# Patient Record
Sex: Female | Born: 1975
Health system: Southern US, Community
[De-identification: ages and names within clinical notes are randomized; demographics above are authoritative.]

---

## 2008-07-29 ENCOUNTER — Encounter: Admission: RE | Admit: 2008-07-29 | Discharge: 2008-07-29 | Payer: Self-pay | Admitting: Family Medicine

## 2010-06-04 ENCOUNTER — Encounter: Payer: Self-pay | Admitting: Family Medicine

## 2010-07-12 ENCOUNTER — Inpatient Hospital Stay (HOSPITAL_COMMUNITY)
Admission: AD | Admit: 2010-07-12 | Discharge: 2010-07-15 | DRG: 373 | Disposition: A | Discharge status: Home / Self Care | Payer: BC Managed Care – PPO | Source: Ambulatory Visit | Attending: Obstetrics and Gynecology | Admitting: Obstetrics and Gynecology

## 2010-07-12 LAB — CBC
HCT: 39.3 % (ref 36.0–46.0)
Hemoglobin: 13.6 g/dL (ref 12.0–15.0)
MCH: 31 pg (ref 26.0–34.0)
MCHC: 34.6 g/dL (ref 30.0–36.0)
MCV: 89.5 fL (ref 78.0–100.0)

## 2010-07-14 LAB — CBC
HCT: 25.5 % — ABNORMAL LOW (ref 36.0–46.0)
MCH: 30.5 pg (ref 26.0–34.0)
MCHC: 33.7 g/dL (ref 30.0–36.0)
MCV: 90.4 fL (ref 78.0–100.0)
RDW: 13 % (ref 11.5–15.5)

## 2012-03-18 ENCOUNTER — Other Ambulatory Visit: Payer: Self-pay | Admitting: Obstetrics and Gynecology

## 2012-03-18 DIAGNOSIS — N6320 Unspecified lump in the left breast, unspecified quadrant: Secondary | ICD-10-CM

## 2012-03-27 ENCOUNTER — Other Ambulatory Visit: Payer: BC Managed Care – PPO

## 2012-06-02 ENCOUNTER — Other Ambulatory Visit: Payer: BC Managed Care – PPO

## 2012-06-04 ENCOUNTER — Other Ambulatory Visit: Payer: BC Managed Care – PPO

## 2012-06-13 ENCOUNTER — Ambulatory Visit
Admission: RE | Admit: 2012-06-13 | Discharge: 2012-06-13 | Disposition: A | Discharge status: Home / Self Care | Payer: BC Managed Care – PPO | Source: Ambulatory Visit | Attending: Obstetrics and Gynecology | Admitting: Obstetrics and Gynecology

## 2012-06-13 DIAGNOSIS — N6320 Unspecified lump in the left breast, unspecified quadrant: Secondary | ICD-10-CM

## 2017-07-20 ENCOUNTER — Encounter: Payer: Self-pay | Admitting: Physician Assistant

## 2017-07-20 ENCOUNTER — Ambulatory Visit (INDEPENDENT_AMBULATORY_CARE_PROVIDER_SITE_OTHER): Payer: 59 | Admitting: Physician Assistant

## 2017-07-20 ENCOUNTER — Ambulatory Visit (INDEPENDENT_AMBULATORY_CARE_PROVIDER_SITE_OTHER): Payer: 59

## 2017-07-20 ENCOUNTER — Other Ambulatory Visit: Payer: Self-pay

## 2017-07-20 VITALS — BP 104/67 | HR 74 | Temp 98.6°F | Resp 16 | Ht 63.0 in | Wt 101.0 lb

## 2017-07-20 DIAGNOSIS — M5416 Radiculopathy, lumbar region: Secondary | ICD-10-CM

## 2017-07-20 DIAGNOSIS — M5441 Lumbago with sciatica, right side: Secondary | ICD-10-CM

## 2017-07-20 LAB — POCT URINALYSIS DIP (MANUAL ENTRY)
Bilirubin, UA: NEGATIVE
Blood, UA: NEGATIVE
Glucose, UA: NEGATIVE mg/dL
Nitrite, UA: NEGATIVE
Protein Ur, POC: NEGATIVE mg/dL
Spec Grav, UA: 1.025 (ref 1.010–1.025)
Urobilinogen, UA: 0.2 E.U./dL
pH, UA: 6 (ref 5.0–8.0)

## 2017-07-20 LAB — POCT URINE PREGNANCY: Preg Test, Ur: NEGATIVE

## 2017-07-20 LAB — POC MICROSCOPIC URINALYSIS (UMFC): Mucus: ABSENT

## 2017-07-20 MED ORDER — PREDNISONE 20 MG PO TABS: ORAL_TABLET | ORAL | 0 refills | Status: DC

## 2017-07-20 MED ORDER — MELOXICAM 15 MG PO TABS: 15.0000 mg | ORAL_TABLET | Freq: Every day | ORAL | 0 refills | Status: DC

## 2017-07-20 MED ORDER — CYCLOBENZAPRINE HCL 10 MG PO TABS: 10.0000 mg | ORAL_TABLET | Freq: Three times a day (TID) | ORAL | 0 refills | Status: DC | PRN

## 2017-07-20 NOTE — Patient Instructions (Addendum)
I am treating you today for radiculopathy (see below). Radiculopathy is the medical term for the pain, numbness, or tingling that occurs when nerves coming from the spinal cord get pinched or damaged.  Start taking the following medications: - Meloxicam is an NSAID. Do not use with any other otc pain medication other than tylenol/acetaminophen - so no aleve, ibuprofen, motrin, advil, etc. You can still take Tylenol while taking this.  - Flexeril is a muscle relaxer. This may make you drowsy - if this makes you drowsy, only take it at night.  - Prednisone will help reduce the inflammation of your nerve. Take the entire course.  You can take these medications at the same time - they do not interact.   - Activity modification is as important as pain treatment. The goals are to lessen nerve root impingement and to avoid activities that worsen the pain. Modification or avoidance of activity does not mean prolonged bed rest. When the acute symptoms decrease, I recommend resuming modest physical activity as tolerated.  - Once your acute pain is better, start stretches (farther below). - Come back and see me in 3-4 weeks if you are not improving.    Sciatica Sciatica is pain, numbness, weakness, or tingling along the path of the sciatic nerve. The sciatic nerve starts in the lower back and runs down the back of each leg. The nerve controls the muscles in the lower leg and in the back of the knee. It also provides feeling (sensation) to the back of the thigh, the lower leg, and the sole of the foot. Sciatica is a symptom of another medical condition that pinches or puts pressure on the sciatic nerve. Generally, sciatica only affects one side of the body. Sciatica usually goes away on its own or with treatment. In some cases, sciatica may keep coming back (recur). What are the causes? This condition is caused by pressure on the sciatic nerve, or pinching of the sciatic nerve. This may be the result of:  A  disk in between the bones of the spine (vertebrae) bulging out too far (herniated disk).  Age-related changes in the spinal disks (degenerative disk disease).  A pain disorder that affects a muscle in the buttock (piriformis syndrome).  Extra bone growth (bone spur) near the sciatic nerve.  An injury or break (fracture) of the pelvis.  Pregnancy.  Tumor (rare).  What increases the risk? The following factors may make you more likely to develop this condition:  Playing sports that place pressure or stress on the spine, such as football or weight lifting.  Having poor strength and flexibility.  A history of back injury.  A history of back surgery.  Sitting for long periods of time.  Doing activities that involve repetitive bending or lifting.  Obesity.  What are the signs or symptoms? Symptoms can vary from mild to very severe, and they may include:  Any of these problems in the lower back, leg, hip, or buttock: ? Mild tingling or dull aches. ? Burning sensations. ? Sharp pains.  Numbness in the back of the calf or the sole of the foot.  Leg weakness.  Severe back pain that makes movement difficult.  These symptoms may get worse when you cough, sneeze, or laugh, or when you sit or stand for long periods of time. Being overweight may also make symptoms worse. In some cases, symptoms may recur over time. How is this diagnosed? This condition may be diagnosed based on:  Your symptoms.  A physical exam. Your health care provider may ask you to do certain movements to check whether those movements trigger your symptoms.  You may have tests, including: ? Blood tests. ? X-rays. ? MRI. ? CT scan.  How is this treated? In many cases, this condition improves on its own, without any treatment. However, treatment may include:  Reducing or modifying physical activity during periods of pain.  Exercising and stretching to strengthen your abdomen and improve the  flexibility of your spine.  Icing and applying heat to the affected area.  Medicines that help: ? To relieve pain and swelling. ? To relax your muscles.  Injections of medicines that help to relieve pain, irritation, and inflammation around the sciatic nerve (steroids).  Surgery.  Follow these instructions at home: Medicines  Take over-the-counter and prescription medicines only as told by your health care provider.  Do not drive or operate heavy machinery while taking prescription pain medicine. Managing pain  If directed, apply ice to the affected area. ? Put ice in a plastic bag. ? Place a towel between your skin and the bag. ? Leave the ice on for 20 minutes, 2-3 times a day.  After icing, apply heat to the affected area before you exercise or as often as told by your health care provider. Use the heat source that your health care provider recommends, such as a moist heat pack or a heating pad. ? Place a towel between your skin and the heat source. ? Leave the heat on for 20-30 minutes. ? Remove the heat if your skin turns bright red. This is especially important if you are unable to feel pain, heat, or cold. You may have a greater risk of getting burned. Activity  Return to your normal activities as told by your health care provider. Ask your health care provider what activities are safe for you. ? Avoid activities that make your symptoms worse.  Take brief periods of rest throughout the day. Resting in a lying or standing position is usually better than sitting to rest. ? When you rest for longer periods, mix in some mild activity or stretching between periods of rest. This will help to prevent stiffness and pain. ? Avoid sitting for long periods of time without moving. Get up and move around at least one time each hour.  Exercise and stretch regularly, as told by your health care provider.  Do not lift anything that is heavier than 10 lb (4.5 kg) while you have symptoms  of sciatica. When you do not have symptoms, you should still avoid heavy lifting, especially repetitive heavy lifting.  When you lift objects, always use proper lifting technique, which includes: ? Bending your knees. ? Keeping the load close to your body. ? Avoiding twisting. General instructions  Use good posture. ? Avoid leaning forward while sitting. ? Avoid hunching over while standing.  Maintain a healthy weight. Excess weight puts extra stress on your back and makes it difficult to maintain good posture.  Wear supportive, comfortable shoes. Avoid wearing high heels.  Avoid sleeping on a mattress that is too soft or too hard. A mattress that is firm enough to support your back when you sleep may help to reduce your pain.  Keep all follow-up visits as told by your health care provider. This is important. Contact a health care provider if:  You have pain that wakes you up when you are sleeping.  You have pain that gets worse when you lie down.  Your  pain is worse than you have experienced in the past.  Your pain lasts longer than 4 weeks.  You experience unexplained weight loss. Get help right away if:  You lose control of your bowel or bladder (incontinence).  You have: ? Weakness in your lower back, pelvis, buttocks, or legs that gets worse. ? Redness or swelling of your back. ? A burning sensation when you urinate. This information is not intended to replace advice given to you by your health care provider. Make sure you discuss any questions you have with your health care provider. Document Released: 04/24/2001 Document Revised: 10/04/2015 Document Reviewed: 01/07/2015 Elsevier Interactive Patient Education  2018 [employer]   Herniated Disk Rehab Ask your health care provider which exercises are safe for you. Do exercises exactly as told by your health care provider and adjust them as directed. It is normal to feel mild stretching, pulling, tightness, or  discomfort as you do these exercises, but you should stop right away if you feel sudden pain or your pain gets worse. Stretching and range of motion exercises These exercises warm up your muscles and joints and improve the movement and flexibility of your back. These exercises also help to relieve pain, numbness, and tingling. Exercise A: Prone extension on elbows  1. Lie on your abdomen on a firm surface. 2. Prop yourself up on your elbows. 3. Use your arms to help lift your chest up until you feel a gentle stretch in your abdomen and your lower back. ? This will place some of your body weight on your elbows. If this is uncomfortable, try stacking pillows under your chest. ? Your hips should stay down, against the surface that you are lying on. Keep your hip and back muscles relaxed. 4. Hold for __________ seconds. 5. Slowly relax your upper body and return to the starting position. Repeat __________ times. Complete this exercise __________ times a day. Exercise B: Standing extension 1. Stand with your feet shoulder width apart. 2. Place your hands on your lower back, with your palms on your back. 3. Gently arch your back and press forward with your hands. 4. Hold for __________ seconds. 5. Slowly return to the starting position. Repeat __________ times. Complete this exercise__________ times a day. Strengthening exercises These exercises build strength and endurance in your back. Endurance is the ability to use your muscles for a long time, even after they get tired. Exercise C: Pelvic tilt 1. Lie on your back on a firm surface. Bend your knees and keep your feet flat. 2. Tense your abdominal muscles. Tip your pelvis up toward the ceiling and flatten your lower back into the floor. ? To help with this exercise, you may place a small towel under your lower back and try to push your back into the towel. 3. Hold for __________ seconds. 4. Let your muscles relax completely before you repeat  this exercise. Repeat __________ times. Complete this exercise __________ times a day. Exercise D: Alternating arm and leg raises  1. Get on your hands and knees on a firm surface. If you are on a hard floor, you may want to use padding to cushion your knees, such as an exercise mat. 2. Line up your arms and legs. Your hands should be below your shoulders, and your knees should be below your hips. 3. Lift your left leg behind you. At the same time, raise your right arm and straighten it in front of you. ? Do not lift your leg higher than  your hip. ? Do not lift your arm higher than your shoulder. ? Keep your abdominal and back muscles tight. ? Keep your hips facing the ground. ? Do not arch your back. ? Keep your balance carefully, and do not hold your breath. 4. Hold for __________ seconds. 5. Slowly return to the starting position and repeat with your right leg and your left arm. Repeat __________ times. Complete this exercise __________ times a day. Exercise E: Bridge  1. Lie on your back on a firm surface with your knees bent and your feet flat on the floor. 2. Tighten your abdominal and buttocks muscles and lift your bottom and abdomen off the floor until your trunk is level with your thighs. ? You should feel the muscles working in your buttocks and the back of your thighs. ? Do not arch your back. ? If this exercise is too easy, try doing it with your arms crossed over your chest. 3. Hold this position for __________ seconds. 4. Slowly lower your hips to the starting position. 5. Let your muscles relax completely between repetitions. Repeat __________ times. Complete this exercise __________ times a day. This information is not intended to replace advice given to you by your health care provider. Make sure you discuss any questions you have with your health care provider. Document Released: 04/30/2005 Document Revised: 01/05/2016 Document Reviewed: 02/15/2015 Elsevier Interactive  Patient Education  2018 [employer].

## 2017-07-20 NOTE — Progress Notes (Signed)
Jordan Bowman  MRN: [id number] DOB: 09/21/1975  PCP: Patient, No Pcp Per  Subjective:  Pt is a 42 year old female who presents to clinic for back and leg pain x several weeks. 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 She believes pain started after moving a heavy load. Pain has been present for several weeks however significantly worsened over the past week.   Pain is of her low back and radiates to the front of her right thigh. She cannot find a comfortable position to sit. Bending over makes it worse.   She has been taking ibuprofen and tylenol. She has been using heat and ice - This is helping.   Denies muscle weakness, t/n, loss of bowel or bladder function, urinary symptoms.   Review of Systems  Constitutional: Negative for chills, diaphoresis, fatigue and fever.  Gastrointestinal: Negative for abdominal pain, diarrhea, nausea and vomiting.  Musculoskeletal: Positive for arthralgias, back pain and myalgias. Negative for gait problem.  Skin: Negative.   Neurological: Negative for weakness and numbness.    There are no active problems to display for this patient.   No current outpatient medications on file prior to visit.   No current facility-administered medications on file prior to visit.     No Known Allergies   Objective:  BP 104/67   Pulse 74   Temp 98.6 F (37 C) (Oral)   Resp 16   Ht 5\' 3"  (1.6 m)   Wt 101 lb (45.8 kg)   LMP 06/14/2017   SpO2 100%   BMI 17.89 kg/m   Physical Exam  Constitutional: She is oriented to person, place, and time and well-developed, well-nourished, and in no distress. No distress.  Cardiovascular: Normal rate, regular rhythm and normal heart sounds.  Musculoskeletal:       Lumbar back: She exhibits decreased range of motion (with bending backwards from standing position. ), tenderness (midline) and bony tenderness. She exhibits no spasm.  Neurological: She is alert and oriented to person, place, and  time. She has normal sensation. She displays no weakness. No sensory deficit. She has an abnormal Straight Leg Raise Test. Gait normal. GCS score is 15.  Skin: Skin is warm and dry.  Psychiatric: Mood, memory, affect and judgment normal.  Vitals reviewed.  Dg Lumbar Spine Complete  Result Date: 07/20/2017 CLINICAL DATA:  Radiating low back pain EXAM: LUMBAR SPINE - COMPLETE 4+ VIEW COMPARISON:  None. FINDINGS: There is no evidence of lumbar spine fracture. Alignment is normal. Intervertebral disc spaces are maintained. IMPRESSION: Negative. Electronically Signed   By: Charlett Nose M.D.   On: 07/20/2017 15:54   Results for orders placed or performed in visit on 07/20/17  POCT urinalysis dipstick  Result Value Ref Range   Color, UA yellow yellow   Clarity, UA clear clear   Glucose, UA negative negative mg/dL   Bilirubin, UA negative negative   Ketones, POC UA trace (5) (A) negative mg/dL   Spec Grav, UA 4.098 1.191 - 1.025   Blood, UA negative negative   pH, UA 6.0 5.0 - 8.0   Protein Ur, POC negative negative mg/dL   Urobilinogen, UA 0.2 0.2 or 1.0 E.U./dL   Nitrite, UA Negative Negative   Leukocytes, UA Trace (A) Negative  POCT urine pregnancy  Result Value Ref Range   Preg Test, Ur Negative Negative  POCT Microscopic Urinalysis ([facility])  Result Value Ref Range   WBC,UR,HPF,POC None None WBC/hpf   RBC,UR,HPF,POC None  None RBC/hpf   Bacteria None None, Too numerous to count   Mucus Absent Absent   Epithelial Cells, UR Per Microscopy Moderate (A) None, Too numerous to count cells/hpf    Assessment and Plan :  1. Lumbar radiculopathy 2. Right-sided low back pain with right-sided sciatica, unspecified chronicity - DG Lumbar Spine Complete; Future - predniSONE (DELTASONE) 20 MG tablet; Take 3 PO QAM x3days, 2 PO QAM x3days, 1 PO QAM x3days  Dispense: 18 tablet; Refill: 0 - meloxicam (MOBIC) 15 MG tablet; Take 1 tablet (15 mg total) by mouth daily.  Dispense: 30 tablet; Refill: 0 -  cyclobenzaprine (FLEXERIL) 10 MG tablet; Take 1 tablet (10 mg total) by mouth 3 (three) times daily as needed for muscle spasms.  Dispense: 30 tablet; Refill: 0 - POCT urinalysis dipstick - POCT urine pregnancy - POCT Microscopic Urinalysis ([facility]) - Pt presents with worsening and radiating low back pain s/p lifting/moving injury x several weeks. HPI and PE suggest lumbar radiculopathy. X-ray is negative. Hcg is negative. UA +trace leukocytes, will await urine culture. Plan to treat with prednisone, mobic and flexeril. Advised stretches once acute pain has resolved. RTC in 3-4 weeks if no improvement. Consider PT or ortho referral. She understands and agrees with plan.   Marco CollieWhitney Jaielle Dlouhy, PA-C  [facility] at [facility] 07/20/2017 3:09 PM

## 2017-08-07 ENCOUNTER — Encounter: Payer: Self-pay | Admitting: Physician Assistant

## 2017-08-07 ENCOUNTER — Ambulatory Visit (INDEPENDENT_AMBULATORY_CARE_PROVIDER_SITE_OTHER): Payer: 59 | Admitting: Physician Assistant

## 2017-08-07 VITALS — BP 99/68 | HR 102 | Temp 102.3°F | Resp 17 | Ht 64.5 in | Wt 100.0 lb

## 2017-08-07 DIAGNOSIS — R059 Cough, unspecified: Secondary | ICD-10-CM

## 2017-08-07 DIAGNOSIS — R05 Cough: Secondary | ICD-10-CM | POA: Diagnosis not present

## 2017-08-07 DIAGNOSIS — R509 Fever, unspecified: Secondary | ICD-10-CM

## 2017-08-07 DIAGNOSIS — R6889 Other general symptoms and signs: Secondary | ICD-10-CM

## 2017-08-07 DIAGNOSIS — J101 Influenza due to other identified influenza virus with other respiratory manifestations: Secondary | ICD-10-CM | POA: Diagnosis not present

## 2017-08-07 LAB — POC INFLUENZA A&B (BINAX/QUICKVUE)
Influenza A, POC: NEGATIVE
Influenza B, POC: POSITIVE — AB

## 2017-08-07 MED ORDER — MUCINEX DM MAXIMUM STRENGTH 60-1200 MG PO TB12: 1.0000 | ORAL_TABLET | Freq: Two times a day (BID) | ORAL | 1 refills | Status: DC

## 2017-08-07 MED ORDER — HYDROCODONE-HOMATROPINE 5-1.5 MG/5ML PO SYRP: 5.0000 mL | ORAL_SOLUTION | Freq: Three times a day (TID) | ORAL | 0 refills | Status: DC | PRN

## 2017-08-07 MED ORDER — ACETAMINOPHEN 500 MG PO TABS
500.0000 mg | ORAL_TABLET | Freq: Once | ORAL | Status: AC
Start: 2017-08-07 — End: 2017-08-07
  Administered 2017-08-07: 500 mg via ORAL

## 2017-08-07 NOTE — Progress Notes (Signed)
   Jordan Bowman  MRN: [id number] DOB: 1976/03/12  PCP: Patient, No Pcp Per  Subjective:  Pt is a 42 year old female who presents to clinic for flu-like symptoms x 4 days. C/o fever, runny nose, cough and HA. +photosensativity. 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   Review of Systems  Constitutional: Positive for fever. Negative for chills, diaphoresis and fatigue.  HENT: Negative for congestion, postnasal drip, rhinorrhea, sinus pressure, sinus pain and sore throat.   Respiratory: Negative for cough, shortness of breath and wheezing.   Neurological: Positive for headaches.  Psychiatric/Behavioral: Negative for sleep disturbance.    There are no active problems to display for this patient.   Current Outpatient Medications on File Prior to Visit  Medication Sig Dispense Refill  . cyclobenzaprine (FLEXERIL) 10 MG tablet Take 1 tablet (10 mg total) by mouth 3 (three) times daily as needed for muscle spasms. (Patient not taking: Reported on 08/07/2017) 30 tablet 0  . meloxicam (MOBIC) 15 MG tablet Take 1 tablet (15 mg total) by mouth daily. (Patient not taking: Reported on 08/07/2017) 30 tablet 0  . predniSONE (DELTASONE) 20 MG tablet Take 3 PO QAM x3days, 2 PO QAM x3days, 1 PO QAM x3days (Patient not taking: Reported on 08/07/2017) 18 tablet 0   No current facility-administered medications on file prior to visit.     No Known Allergies   Objective:  BP 99/68   Pulse (!) 102   Temp (!) 102.3 F (39.1 C) (Oral)   Resp 17   Ht 5' 4.5" (1.638 m)   Wt 100 lb (45.4 kg)   LMP 07/31/2017 (Approximate)   SpO2 98%   BMI 16.90 kg/m   Physical Exam  Constitutional: She is oriented to person, place, and time and well-developed, well-nourished, and in no distress. No distress.  Supine on exam table with tissues scattered about  HENT:  Right Ear: Tympanic membrane normal.  Left Ear: Tympanic membrane normal.  Nose: Mucosal edema present. No rhinorrhea. Right sinus  exhibits no maxillary sinus tenderness and no frontal sinus tenderness. Left sinus exhibits no maxillary sinus tenderness and no frontal sinus tenderness.  Mouth/Throat: Oropharynx is clear and moist and mucous membranes are normal.  Cardiovascular: Normal rate, regular rhythm and normal heart sounds.  Pulmonary/Chest: Effort normal and breath sounds normal. No respiratory distress. She has no wheezes. She has no rales.  Neurological: She is alert and oriented to person, place, and time. GCS score is 15.  Skin: Skin is warm and dry.  Psychiatric: Mood, memory, affect and judgment normal.  Vitals reviewed.   Assessment and Plan :  1. Influenza B 2. Flu-like symptoms - POC Influenza A&B (Binax test) - Pt presents with flu-like symptoms x 4 days. She is + influenza A. Plan to treat supportively. OOW note written. Advised rest, tylenol for fever and hydration. RTC if no improvement in 5-7 days.  3. Fever, unspecified fever cause - acetaminophen (TYLENOL) tablet 500 mg  4. Cough - HYDROcodone-homatropine (HYCODAN) 5-1.5 MG/5ML syrup; Take 5 mLs by mouth every 8 (eight) hours as needed for cough.  Dispense: 120 mL; Refill: 0 - Dextromethorphan-guaiFENesin (MUCINEX DM MAXIMUM STRENGTH) 60-1200 MG TB12; Take 1 tablet by mouth every 12 (twelve) hours.  Dispense: 14 each; Refill: 1   Whitney Doree Kuehne, PA-C  Primary Care at [facility] 08/07/2017 1:48 PM

## 2017-08-07 NOTE — Patient Instructions (Addendum)
You are positive for the flu.  Take Tylenol and ibuprofen (together) for your fever and body aches.  Hycodan is a hydrocodone-base cough medicine. This will make you drowsy. Take this as directed.  Start taking Mucinex DM for the next 3-4 days.   Stay well hydrated.  Get lost of rest. Wash your hands often.   -Foods that can help speed recovery: honey, garlic, chicken soup, elderberries, green tea.  -Supplements that can help speed recovery: vitamin C, zinc, elderberry extract, quercetin, ginseng, selenium -Supplement with prebiotics and probiotics:   Advil or ibuprofen for pain. Do not take Aspirin.  Drink enough water and fluids to keep your urine clear or pale yellow.  For sore throat try using a honey-based tea. Use 3 teaspoons of honey with juice squeezed from half lemon. Place shaved pieces of ginger into 1/2-1 cup of water and warm over stove top. Then mix the ingredients and repeat every 4 hours as needed.  Cough Syrup Recipe: Sweet Lemon & Honey Thyme  Ingredients a handful of fresh thyme sprigs   1 pint of water (2 cups)  1/2 cup honey (raw is best, but regular will do)  1/2 lemon chopped Instructions 1. Place the lemon in the pint jar and cover with the honey. The honey will macerate the lemons and draw out liquids which taste so delicious! 2. Meanwhile, toss the thyme leaves into a saucepan and cover them with the water. 3. Bring the water to a gentle simmer and reduce it to half, about a cup of tea. 4. When the tea is reduced and cooled a bit, strain the sprigs & leaves, add it into the pint jar and stir it well. 5. Give it a shake and use a spoonful as needed. 6. Store your homemade cough syrup in the refrigerator for about a month.     Influenza, Adult Influenza, more commonly known as "the flu," is a viral infection that primarily affects the respiratory tract. The respiratory tract includes organs that help you breathe, such as the lungs, nose, and throat. The flu  causes many common cold symptoms, as well as a high fever and body aches. The flu spreads easily from person to person (is contagious). Getting a flu shot (influenza vaccination) every year is the best way to prevent influenza. What are the causes? Influenza is caused by a virus. You can catch the virus by:  Breathing in droplets from an infected person's cough or sneeze.  Touching something that was recently contaminated with the virus and then touching your mouth, nose, or eyes.  What increases the risk? The following factors may make you more likely to get the flu:  Not cleaning your hands frequently with soap and water or alcohol-based hand sanitizer.  Having close contact with many people during cold and flu season.  Touching your mouth, eyes, or nose without washing or sanitizing your hands first.  Not drinking enough fluids or not eating a healthy diet.  Not getting enough sleep or exercise.  Being under a high amount of stress.  Not getting a yearly (annual) flu shot.  You may be at a higher risk of complications from the flu, such as a severe lung infection (pneumonia), if you:  Are over the age of 64.  Are pregnant.  Have a weakened disease-fighting system (immune system). You may have a weakened immune system if you: ? Have HIV or AIDS. ? Are undergoing chemotherapy. ? Aretaking medicines that reduce the activity of (suppress) the immune  system.  Have a long-term (chronic) illness, such as heart disease, kidney disease, diabetes, or lung disease.  Have a liver disorder.  Are obese.  Have anemia.  What are the signs or symptoms? Symptoms of this condition typically last 4-10 days and may include:  Fever.  Chills.  Headache, body aches, or muscle aches.  Sore throat.  Cough.  Runny or congested nose.  Chest discomfort and cough.  Poor appetite.  Weakness or tiredness (fatigue).  Dizziness.  Nausea or vomiting.  How is this  diagnosed? This condition may be diagnosed based on your medical history and a physical exam. Your health care provider may do a nose or throat swab test to confirm the diagnosis. How is this treated? If influenza is detected early, you can be treated with antiviral medicine that can reduce the length of your illness and the severity of your symptoms. This medicine may be given by mouth (orally) or through an IV tube that is inserted in one of your veins. The goal of treatment is to relieve symptoms by taking care of yourself at home. This may include taking over-the-counter medicines, drinking plenty of fluids, and adding humidity to the air in your home. In some cases, influenza goes away on its own. Severe influenza or complications from influenza may be treated in a hospital. Follow these instructions at home:  Take over-the-counter and prescription medicines only as told by your health care provider.  Use a cool mist humidifier to add humidity to the air in your home. This can make breathing easier.  Rest as needed.  Drink enough fluid to keep your urine clear or pale yellow.  Cover your mouth and nose when you cough or sneeze.  Wash your hands with soap and water often, especially after you cough or sneeze. If soap and water are not available, use hand sanitizer.  Stay home from work or school as told by your health care provider. Unless you are visiting your health care provider, try to avoid leaving home until your fever has been gone for 24 hours without the use of medicine.  Keep all follow-up visits as told by your health care provider. This is important. How is this prevented?  Getting an annual flu shot is the best way to avoid getting the flu. You may get the flu shot in late summer, fall, or winter. Ask your health care provider when you should get your flu shot.  Wash your hands often or use hand sanitizer often.  Avoid contact with people who are sick during cold and flu  season.  Eat a healthy diet, drink plenty of fluids, get enough sleep, and exercise regularly. Contact a health care provider if:  You develop new symptoms.  You have: ? Chest pain. ? Diarrhea. ? A fever.  Your cough gets worse.  You produce more mucus.  You feel nauseous or you vomit. Get help right away if:  You develop shortness of breath or difficulty breathing.  Your skin or nails turn a bluish color.  You have severe pain or stiffness in your neck.  You develop a sudden headache or sudden pain in your face or ear.  You cannot stop vomiting. This information is not intended to replace advice given to you by your health care provider. Make sure you discuss any questions you have with your health care provider. Document Released: 04/27/2000 Document Revised: 10/06/2015 Document Reviewed: 02/22/2015 Elsevier Interactive Patient Education  2017 [place] you for coming in  today. I hope you feel we met your needs.  Feel free to call PCP if you have any questions or further requests.  Please consider signing up for MyChart if you do not already have it, as this is a great way to communicate with me.  Best,  Whitney McVey, PA-C  IF you received an x-ray today, you will receive an invoice from [facility]. Please contact [facility] at [phone] with questions or concerns regarding your invoice.   IF you received labwork today, you will receive an invoice from [facility]. Please contact [facility] at [phone] with questions or concerns regarding your invoice.   Our billing staff will not be able to assist you with questions regarding bills from these companies.  You will be contacted with the lab results as soon as they are available. The fastest way to get your results is to activate your My Chart account. Instructions are located on the last page of this paperwork. If you have not heard from [place] regarding the results in 2 weeks, please  contact this office.

## 2017-08-12 ENCOUNTER — Telehealth: Payer: Self-pay | Admitting: Physician Assistant

## 2017-08-12 NOTE — Telephone Encounter (Signed)
Copied from CRM [phone]. Topic: Quick Communication - Rx Refill/Question >> Aug 12, 2017  1:02 PM [name], Harvey B wrote: Pt called to state that she does not have an appetite and is wanting to know that if she can be prescribed a medication to help her consume food, call pt to advise

## 2017-08-13 ENCOUNTER — Ambulatory Visit: Payer: Self-pay | Admitting: *Deleted

## 2017-08-13 NOTE — Telephone Encounter (Signed)
This is a new condition. Last seen for flu. Please schedule OV.

## 2017-08-13 NOTE — Telephone Encounter (Signed)
Left message on voice mail to call back to discuss problem.

## 2017-08-13 NOTE — Telephone Encounter (Signed)
Pt's husband called because his wife has no appetite; he has looked at OTC medication but would like a prescription for something better; he states that this has been going on since the pt has been treated for the flu; he states that she has not lost weight and is not having weakness; the pt was last seen in the office 08/07/17 by Marco CollieWhitney McVey;  will route to office for notification of pt request.

## 2017-08-14 NOTE — Telephone Encounter (Addendum)
Contacted pt and relayed information per Lars MageJuan; she requests appointment on Tuesday 08/20/17;  [facility] [address], [place] on 08/19/17 at 1540; the pt verbalizes understanding.

## 2017-08-20 ENCOUNTER — Ambulatory Visit: Payer: Self-pay | Admitting: Physician Assistant

## 2017-09-05 ENCOUNTER — Telehealth: Payer: Self-pay | Admitting: Physician Assistant

## 2017-09-05 NOTE — Telephone Encounter (Signed)
Called and spoke to pt trying to reschedule her for her missed appt from 4/9 with McVey. She was at work and unable to reschedule her appt so she said that she would call in and reschedule. When she calls in, please schedule her with McVey at her convenience. Thanks!

## 2017-11-08 ENCOUNTER — Encounter: Payer: Self-pay | Admitting: Physician Assistant

## 2017-11-08 ENCOUNTER — Ambulatory Visit (INDEPENDENT_AMBULATORY_CARE_PROVIDER_SITE_OTHER): Payer: 59 | Admitting: Physician Assistant

## 2017-11-08 VITALS — BP 100/66 | HR 73 | Temp 98.1°F | Resp 16 | Ht 63.0 in | Wt 98.0 lb

## 2017-11-08 DIAGNOSIS — Z130 Encounter for screening for diseases of the blood and blood-forming organs and certain disorders involving the immune mechanism: Secondary | ICD-10-CM

## 2017-11-08 DIAGNOSIS — Z111 Encounter for screening for respiratory tuberculosis: Secondary | ICD-10-CM

## 2017-11-08 DIAGNOSIS — Z13228 Encounter for screening for other metabolic disorders: Secondary | ICD-10-CM

## 2017-11-08 DIAGNOSIS — Z0000 Encounter for general adult medical examination without abnormal findings: Secondary | ICD-10-CM

## 2017-11-08 DIAGNOSIS — Z1329 Encounter for screening for other suspected endocrine disorder: Secondary | ICD-10-CM

## 2017-11-08 NOTE — Progress Notes (Signed)
  Tuberculosis Risk Questionnaire  1. Yes  Were you born outside the [place] in one of the following parts of the world: [place], [place], [place], [place] or [place]?    2. No Have you traveled outside the [place] and lived for more than one month in one of the following parts of the world: [place], [place], [place], [place] or [place]?    3. No Do you have a compromised immune system such as from any of the following conditions:HIV/AIDS, organ or bone marrow transplantation, diabetes, immunosuppressive medicines (e.g. Prednisone, Remicaide), leukemia, lymphoma, cancer of the head or neck, gastrectomy or jejunal bypass, end-stage renal disease (on dialysis), or silicosis?     4. No Have you ever or do you plan on working in: a residential care center, a health care facility, a jail or prison or homeless shelter?    5. No Have you ever: injected illegal drugs, used crack cocaine, lived in a homeless shelter  or been in jail or prison?     6. No Have you ever been exposed to anyone with infectious tuberculosis?  7. No Have you ever had a BCG vaccine? (BCG is a vaccine for tuberculosis  (TB) used in OTHER countries, NOT in the [place]).  8. No Have you ever been advised by a health care provider NOT to have a TB skin test?  9. No Have you ever had a POSITIVE TB skin test?  IF SO, when?   IF SO, were you treated with INH?   Tuberculosis Symptom Questionnaire  Do you currently have any of the following symptoms?  1. No Unexplained cough lasting more than 3 weeks?   2. No Unexplained fever lasting more than 3 weeks.   3. No Night Sweats (sweating that leaves the bedclothes and sheets wet)     4. No Shortness of Breath    5. No Chest Pain   6. No Unintentional weight loss    7. No Unexplained fatigue (very tired for no reason)

## 2017-11-08 NOTE — Patient Instructions (Addendum)
Come back Monday morning before 11am to have your TB skin test read. Bring your paperwork.  Please come back for cervical cancer screening (see below).   Pap Test Why am I having this test? A pap test is sometimes called a pap smear. It is a screening test that is used to check for signs of cancer of the vagina, cervix, and uterus. The test can also identify the presence of infection or precancerous changes. Your health care provider will likely recommend you have this test done on a regular basis. This test may be done:  Every 3 years, starting at age 63.  Every 5 years, in combination with testing for the presence of human papillomavirus (HPV).  More or less often depending on other medical conditions.  What kind of sample is taken? Using a small cotton swab, plastic spatula, or brush, your health care provider will collect a sample of cells from the surface of your cervix. Your cervix is the opening to your uterus, also called a womb. Secretions from the cervix and vagina may also be collected. How do I prepare for this test?  Be aware of where you are in your menstrual cycle. You may be asked to reschedule the test if you are menstruating on the day of the test.  You may need to reschedule if you have a known vaginal infection on the day of the test.  You may be asked to avoid douching or taking a bath the day before or the day of the test.  Some medicines can cause abnormal test results, such as digitalis and tetracycline. Talk with your health care provider before your test if you take one of these medicines. What do the results mean? Abnormal test results may indicate a number of health conditions. These may include:  Cancer. Although pap test results cannot be used to diagnose cancer of the cervix, vagina, or uterus, they may suggest the possibility of cancer. Further tests would be required to determine if cancer is present.  Sexually transmitted disease.  Fungal  infection.  Parasite infection.  Herpes infection.  A condition causing or contributing to infertility.  It is your responsibility to obtain your test results. Ask the lab or department performing the test when and how you will get your results. Contact your health care provider to discuss any questions you have about your results. Talk with your health care provider to discuss your results, treatment options, and if necessary, the need for more tests. Talk with your health care provider if you have any questions about your results. This information is not intended to replace advice given to you by your health care provider. Make sure you discuss any questions you have with your health care provider. Document Released: 07/21/2002 Document Revised: 01/04/2016 Document Reviewed: 09/21/2013 Elsevier Interactive Patient Education  2018 [employer] Maintenance, Female Adopting a healthy lifestyle and getting preventive care can go a long way to promote health and wellness. Talk with your health care provider about what schedule of regular examinations is right for you. This is a good chance for you to check in with your provider about disease prevention and staying healthy. In between checkups, there are plenty of things you can do on your own. Experts have done a lot of research about which lifestyle changes and preventive measures are most likely to keep you healthy. Ask your health care provider for more information. Weight and diet Eat a healthy diet  Be sure to include plenty of  vegetables, fruits, low-fat dairy products, and lean protein.  Do not eat a lot of foods high in solid fats, added sugars, or salt.  Get regular exercise. This is one of the most important things you can do for your health. ? Most adults should exercise for at least 150 minutes each week. The exercise should increase your heart rate and make you sweat (moderate-intensity exercise). ? Most adults should  also do strengthening exercises at least twice a week. This is in addition to the moderate-intensity exercise.  Maintain a healthy weight  Body mass index (BMI) is a measurement that can be used to identify possible weight problems. It estimates body fat based on height and weight. Your health care provider can help determine your BMI and help you achieve or maintain a healthy weight.  For females 71 years of age and older: ? A BMI below 18.5 is considered underweight. ? A BMI of 18.5 to 24.9 is normal. ? A BMI of 25 to 29.9 is considered overweight. ? A BMI of 30 and above is considered obese.  Watch levels of cholesterol and blood lipids  You should start having your blood tested for lipids and cholesterol at 40 years of age, then have this test every 5 years.  You may need to have your cholesterol levels checked more often if: ? Your lipid or cholesterol levels are high. ? You are older than 40 years of age. ? You are at high risk for heart disease.  Cancer screening Lung Cancer  Lung cancer screening is recommended for adults 65-22 years old who are at high risk for lung cancer because of a history of smoking.  A yearly low-dose CT scan of the lungs is recommended for people who: ? Currently smoke. ? Have quit within the past 15 years. ? Have at least a 30-pack-year history of smoking. A pack year is smoking an average of one pack of cigarettes a day for 1 year.  Yearly screening should continue until it has been 15 years since you quit.  Yearly screening should stop if you develop a health problem that would prevent you from having lung cancer treatment.  Breast Cancer  Practice breast self-awareness. This means understanding how your breasts normally appear and feel.  It also means doing regular breast self-exams. Let your health care provider know about any changes, no matter how small.  If you are in your 20s or 30s, you should have a clinical breast exam (CBE) by a  health care provider every 1-3 years as part of a regular health exam.  If you are 2 or older, have a CBE every year. Also consider having a breast X-ray (mammogram) every year.  If you have a family history of breast cancer, talk to your health care provider about genetic screening.  If you are at high risk for breast cancer, talk to your health care provider about having an MRI and a mammogram every year.  Breast cancer gene (BRCA) assessment is recommended for women who have family members with BRCA-related cancers. BRCA-related cancers include: ? Breast. ? Ovarian. ? Tubal. ? Peritoneal cancers.  Results of the assessment will determine the need for genetic counseling and BRCA1 and BRCA2 testing.  Cervical Cancer Your health care provider may recommend that you be screened regularly for cancer of the pelvic organs (ovaries, uterus, and vagina). This screening involves a pelvic examination, including checking for microscopic changes to the surface of your cervix (Pap test). You may be encouraged to  have this screening done every 3 years, beginning at age 41.  For women ages 87-65, health care providers may recommend pelvic exams and Pap testing every 3 years, or they may recommend the Pap and pelvic exam, combined with testing for human papilloma virus (HPV), every 5 years. Some types of HPV increase your risk of cervical cancer. Testing for HPV may also be done on women of any age with unclear Pap test results.  Other health care providers may not recommend any screening for nonpregnant women who are considered low risk for pelvic cancer and who do not have symptoms. Ask your health care provider if a screening pelvic exam is right for you.  If you have had past treatment for cervical cancer or a condition that could lead to cancer, you need Pap tests and screening for cancer for at least 20 years after your treatment. If Pap tests have been discontinued, your risk factors (such as having  a new sexual partner) need to be reassessed to determine if screening should resume. Some women have medical problems that increase the chance of getting cervical cancer. In these cases, your health care provider may recommend more frequent screening and Pap tests.  Colorectal Cancer  This type of cancer can be detected and often prevented.  Routine colorectal cancer screening usually begins at 40 years of age and continues through 40 years of age.  Your health care provider may recommend screening at an earlier age if you have risk factors for colon cancer.  Your health care provider may also recommend using home test kits to check for hidden blood in the stool.  A small camera at the end of a tube can be used to examine your colon directly (sigmoidoscopy or colonoscopy). This is done to check for the earliest forms of colorectal cancer.  Routine screening usually begins at age 8.  Direct examination of the colon should be repeated every 5-10 years through 40 years of age. However, you may need to be screened more often if early forms of precancerous polyps or small growths are found.  Skin Cancer  Check your skin from head to toe regularly.  Tell your health care provider about any new moles or changes in moles, especially if there is a change in a mole's shape or color.  Also tell your health care provider if you have a mole that is larger than the size of a pencil eraser.  Always use sunscreen. Apply sunscreen liberally and repeatedly throughout the day.  Protect yourself by wearing long sleeves, pants, a wide-brimmed hat, and sunglasses whenever you are outside.  Heart disease, diabetes, and high blood pressure  High blood pressure causes heart disease and increases the risk of stroke. High blood pressure is more likely to develop in: ? People who have blood pressure in the high end of the normal range (130-139/85-89 mm Hg). ? People who are overweight or obese. ? People who  are African American.  If you are 35-75 years of age, have your blood pressure checked every 3-5 years. If you are 65 years of age or older, have your blood pressure checked every year. You should have your blood pressure measured twice-once when you are at a hospital or clinic, and once when you are not at a hospital or clinic. Record the average of the two measurements. To check your blood pressure when you are not at a hospital or clinic, you can use: ? An automated blood pressure machine at a pharmacy. ?  A home blood pressure monitor.  If you are between 63 years and 33 years old, ask your health care provider if you should take aspirin to prevent strokes.  Have regular diabetes screenings. This involves taking a blood sample to check your fasting blood sugar level. ? If you are at a normal weight and have a low risk for diabetes, have this test once every three years after 40 years of age. ? If you are overweight and have a high risk for diabetes, consider being tested at a younger age or more often. Preventing infection Hepatitis B  If you have a higher risk for hepatitis B, you should be screened for this virus. You are considered at high risk for hepatitis B if: ? You were born in a country where hepatitis B is common. Ask your health care provider which countries are considered high risk. ? Your parents were born in a high-risk country, and you have not been immunized against hepatitis B (hepatitis B vaccine). ? You have HIV or AIDS. ? You use needles to inject street drugs. ? You live with someone who has hepatitis B. ? You have had sex with someone who has hepatitis B. ? You get hemodialysis treatment. ? You take certain medicines for conditions, including cancer, organ transplantation, and autoimmune conditions.  Hepatitis C  Blood testing is recommended for: ? Everyone born from [birth year] through 1963. ? Anyone with known risk factors for hepatitis C.  Sexually transmitted  infections (STIs)  You should be screened for sexually transmitted infections (STIs) including gonorrhea and chlamydia if: ? You are sexually active and are younger than 40 years of age. ? You are older than 40 years of age and your health care provider tells you that you are at risk for this type of infection. ? Your sexual activity has changed since you were last screened and you are at an increased risk for chlamydia or gonorrhea. Ask your health care provider if you are at risk.  If you do not have HIV, but are at risk, it may be recommended that you take a prescription medicine daily to prevent HIV infection. This is called pre-exposure prophylaxis (PrEP). You are considered at risk if: ? You are sexually active and do not regularly use condoms or know the HIV status of your partner(s). ? You take drugs by injection. ? You are sexually active with a partner who has HIV.  Talk with your health care provider about whether you are at high risk of being infected with HIV. If you choose to begin PrEP, you should first be tested for HIV. You should then be tested every 3 months for as long as you are taking PrEP. Pregnancy  If you are premenopausal and you may become pregnant, ask your health care provider about preconception counseling.  If you may become pregnant, take 400 to 800 micrograms (mcg) of folic acid every day.  If you want to prevent pregnancy, talk to your health care provider about birth control (contraception). Osteoporosis and menopause  Osteoporosis is a disease in which the bones lose minerals and strength with aging. This can result in serious bone fractures. Your risk for osteoporosis can be identified using a bone density scan.  If you are 28 years of age or older, or if you are at risk for osteoporosis and fractures, ask your health care provider if you should be screened.  Ask your health care provider whether you should take a calcium or vitamin D supplement to  lower  your risk for osteoporosis.  Menopause may have certain physical symptoms and risks.  Hormone replacement therapy may reduce some of these symptoms and risks. Talk to your health care provider about whether hormone replacement therapy is right for you. Follow these instructions at home:  Schedule regular health, dental, and eye exams.  Stay current with your immunizations.  Do not use any tobacco products including cigarettes, chewing tobacco, or electronic cigarettes.  If you are pregnant, do not drink alcohol.  If you are breastfeeding, limit how much and how often you drink alcohol.  Limit alcohol intake to no more than 1 drink per day for nonpregnant women. One drink equals 12 ounces of beer, 5 ounces of wine, or 1 ounces of hard liquor.  Do not use street drugs.  Do not share needles.  Ask your health care provider for help if you need support or information about quitting drugs.  Tell your health care provider if you often feel depressed.  Tell your health care provider if you have ever been abused or do not feel safe at home. This information is not intended to replace advice given to you by your health care provider. Make sure you discuss any questions you have with your health care provider. Document Released: 11/13/2010 Document Revised: 10/06/2015 Document Reviewed: 02/01/2015 Elsevier Interactive Patient Education  2018 [employer].  IF you received an x-ray today, you will receive an invoice from [facility]. Please contact [facility] at [phone] with questions or concerns regarding your invoice.   IF you received labwork today, you will receive an invoice from [facility]. Please contact [facility] at [phone] with questions or concerns regarding your invoice.   Our billing staff will not be able to assist you with questions regarding bills from these companies.  You will be contacted with the lab results as soon as they are available. The  fastest way to get your results is to activate your My Chart account. Instructions are located on the last page of this paperwork. If you have not heard from [place] regarding the results in 2 weeks, please contact this office.

## 2017-11-08 NOTE — Progress Notes (Signed)
[facility] [place], [place] [address] [phone]  Date:  11/08/2017   Name:  Jordan Bowman   DOB:  [birth year]   MRN:  [id number]  PCP:  Patient, No Pcp Per    Chief Complaint: Annual Exam (w/forms to fill out)   History of Present Illness:  This is a pleasant 42 y.o. female  has no past medical history on file. who is presenting for CPE. Moved here from [place] about 11 years ago. 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 Needs TB screening. She is feeling well today.   Complaints: none LMP: 11/04/2017 Contraception:   Last pap: not utd. She believes she has never had a PAP. She is unfamiliar with this procedure. She declines having this done today.  Sexual history: active. Married x 20 years with two children.  Immunizations: needs tdap Eye: 20/20 b/l  Fam hx: family history is not on file. healthy.   Tobacco/alcohol/substance use: Never Smoker, no drug use.   Review of Systems:  Review of Systems  Constitutional: Negative for chills, diaphoresis, fatigue and fever.  HENT: Negative for congestion, postnasal drip, rhinorrhea, sinus pressure, sneezing and sore throat.   Respiratory: Negative for cough, chest tightness, shortness of breath and wheezing.   Cardiovascular: Negative for chest pain and palpitations.  Gastrointestinal: Negative for abdominal pain, diarrhea, nausea and vomiting.  Genitourinary: Negative for decreased urine volume, difficulty urinating, dysuria, enuresis, flank pain, frequency, hematuria and urgency.  Musculoskeletal: Negative for back pain.  Neurological: Negative for dizziness, weakness, light-headedness and headaches.    There are no active problems to display for this patient.   Prior to Admission medications   Not on File    No Known Allergies   Social History   Tobacco Use  . Smoking status: Never Smoker  . Smokeless tobacco: Never Used  Substance Use Topics  . Alcohol  use: Yes    Frequency: Never    Comment: occasional  . Drug use: No    No family history on file.  Medication list has been reviewed and updated.  Physical Examination:  Physical Exam  Constitutional: She is oriented to person, place, and time. She appears well-developed and well-nourished. No distress.  HENT:  Head: Normocephalic and atraumatic.  Mouth/Throat: Oropharynx is clear and moist.  Eyes: Pupils are equal, round, and reactive to light. Conjunctivae and EOM are normal.  Neck: Normal range of motion. Neck supple. No thyromegaly present.  Cardiovascular: Normal rate, regular rhythm and normal heart sounds.  No murmur heard. Pulmonary/Chest: Effort normal and breath sounds normal. She has no wheezes.  Abdominal: Soft. She exhibits no distension. There is no tenderness.  Musculoskeletal: Normal range of motion.  Neurological: She is alert and oriented to person, place, and time. She has normal reflexes.  Skin: Skin is warm and dry.  Psychiatric: She has a normal mood and affect. Her behavior is normal. Judgment and thought content normal.  Vitals reviewed.   BP 100/66 (BP Location: Right Arm, Patient Position: Sitting, Cuff Size: Normal)   Pulse 73   Temp 98.1 F (36.7 C) (Oral)   Resp 16   Ht _0  (1.6 m)   Wt 98 lb (44.5 kg)   LMP 11/04/2017   SpO2 100%   BMI 17.36 kg/m   Assessment and Plan: 1. Annual physical exam -Patient presents for annual exam, she has forms to be filled out for her new job.  She  is feeling well today.  Patient is unfamiliar with a cervical cancer screening.  This was discussed with her today and information was provided.  She will consider this and come back to have this done.  Routine labs drawn today, will contact with results.  2. Screening for endocrine, metabolic and immunity disorder - CBC with Differential/Platelet - CMP14+EGFR - Lipid panel - TSH  3. Screening-pulmonary TB - TB Skin Test   Mercer Pod, PA-C  [facility] 11/08/2017 10:36 AM

## 2017-11-09 LAB — CBC WITH DIFFERENTIAL/PLATELET
Basophils Absolute: 0 10*3/uL (ref 0.0–0.2)
Basos: 1 %
EOS (ABSOLUTE): 0.2 10*3/uL (ref 0.0–0.4)
Eos: 5 %
Hematocrit: 37.9 % (ref 34.0–46.6)
Hemoglobin: 12.9 g/dL (ref 11.1–15.9)
Immature Grans (Abs): 0 10*3/uL (ref 0.0–0.1)
Immature Granulocytes: 0 %
Lymphocytes Absolute: 1.5 10*3/uL (ref 0.7–3.1)
Lymphs: 39 %
MCH: 29.7 pg (ref 26.6–33.0)
MCHC: 34 g/dL (ref 31.5–35.7)
MCV: 87 fL (ref 79–97)
Monocytes Absolute: 0.2 10*3/uL (ref 0.1–0.9)
Monocytes: 5 %
Neutrophils Absolute: 1.8 10*3/uL (ref 1.4–7.0)
Neutrophils: 50 %
Platelets: 282 10*3/uL (ref 150–450)
RBC: 4.34 x10E6/uL (ref 3.77–5.28)
RDW: 12.8 % (ref 12.3–15.4)
WBC: 3.7 10*3/uL (ref 3.4–10.8)

## 2017-11-09 LAB — CMP14+EGFR
ALT: 14 IU/L (ref 0–32)
AST: 20 IU/L (ref 0–40)
Albumin/Globulin Ratio: 2.1 (ref 1.2–2.2)
Albumin: 4.5 g/dL (ref 3.5–5.5)
Alkaline Phosphatase: 43 IU/L (ref 39–117)
BUN/Creatinine Ratio: 13 (ref 9–23)
BUN: 9 mg/dL (ref 6–24)
Bilirubin Total: <0.2 mg/dL (ref 0.0–1.2)
CO2: 22 mmol/L (ref 20–29)
Calcium: 9 mg/dL (ref 8.7–10.2)
Chloride: 105 mmol/L (ref 96–106)
Creatinine, Ser: 0.71 mg/dL (ref 0.57–1.00)
GFR calc Af Amer: 123 mL/min/{1.73_m2} (ref >59)
GFR calc non Af Amer: 107 mL/min/{1.73_m2} (ref >59)
Globulin, Total: 2.1 g/dL (ref 1.5–4.5)
Glucose: 81 mg/dL (ref 65–99)
Potassium: 3.9 mmol/L (ref 3.5–5.2)
Sodium: 141 mmol/L (ref 134–144)
Total Protein: 6.6 g/dL (ref 6.0–8.5)

## 2017-11-09 LAB — LIPID PANEL
Chol/HDL Ratio: 2.4 ratio (ref 0.0–4.4)
Cholesterol, Total: 147 mg/dL (ref 100–199)
HDL: 62 mg/dL (ref >39)
LDL Calculated: 77 mg/dL (ref 0–99)
Triglycerides: 41 mg/dL (ref 0–149)
VLDL Cholesterol Cal: 8 mg/dL (ref 5–40)

## 2017-11-09 LAB — TSH: TSH: 1.74 u[IU]/mL (ref 0.450–4.500)

## 2017-11-11 ENCOUNTER — Ambulatory Visit (INDEPENDENT_AMBULATORY_CARE_PROVIDER_SITE_OTHER): Payer: 59 | Admitting: Family Medicine

## 2017-11-11 DIAGNOSIS — Z23 Encounter for immunization: Secondary | ICD-10-CM | POA: Diagnosis not present

## 2017-11-11 LAB — TB SKIN TEST
Induration: 0 mm
TB Skin Test: NEGATIVE

## 2017-11-11 NOTE — Progress Notes (Signed)
Hep B given

## 2017-11-18 NOTE — Addendum Note (Signed)
Addended by: Lisbeth RenshawHAN VELASCO, Taziyah Iannuzzi HUA on: 11/18/2017 06:07 PM   Modules accepted: Orders

## 2017-11-21 ENCOUNTER — Encounter: Payer: Self-pay | Admitting: Physician Assistant

## 2019-04-07 ENCOUNTER — Other Ambulatory Visit: Payer: Self-pay

## 2019-04-07 DIAGNOSIS — Z20822 Contact with and (suspected) exposure to covid-19: Secondary | ICD-10-CM

## 2019-04-09 LAB — NOVEL CORONAVIRUS, NAA: SARS-CoV-2, NAA: NOT DETECTED

## 2019-07-09 ENCOUNTER — Ambulatory Visit: Payer: Self-pay | Attending: Family

## 2019-07-09 DIAGNOSIS — Z23 Encounter for immunization: Secondary | ICD-10-CM | POA: Insufficient documentation

## 2019-07-09 NOTE — Progress Notes (Signed)
   Covid-19 Vaccination Clinic  Name:  Jordan Bowman    MRN: [id number] DOB: 01/20/1976  07/09/2019  [name] was observed post Covid-19 immunization for 15 minutes without incidence. She was provided with Vaccine Information Sheet and instruction to access the V-Safe system.   [name] was instructed to call 911 with any severe reactions post vaccine: Marland Kitchen Difficulty breathing  . Swelling of your face and throat  . A fast heartbeat  . A bad rash all over your body  . Dizziness and weakness    Immunizations Administered    Name Date Dose VIS Date Route   Moderna COVID-19 Vaccine 07/09/2019  2:51 PM 0.5 mL 04/14/2019 Intramuscular   Manufacturer: Moderna   Lot: [id number]   NDC: 79728-206-01

## 2019-08-04 ENCOUNTER — Ambulatory Visit: Payer: Self-pay | Attending: Family

## 2019-08-04 DIAGNOSIS — Z23 Encounter for immunization: Secondary | ICD-10-CM

## 2019-08-04 NOTE — Progress Notes (Signed)
   Covid-19 Vaccination Clinic  Name:  Jordan Bowman    MRN: [id number] DOB: Dec 23, 1975  08/04/2019  [name] was observed post Covid-19 immunization for 15 minutes without incident. She was provided with Vaccine Information Sheet and instruction to access the V-Safe system.   [name] was instructed to call 911 with any severe reactions post vaccine: Marland Kitchen Difficulty breathing  . Swelling of face and throat  . A fast heartbeat  . A bad rash all over body  . Dizziness and weakness   Immunizations Administered    Name Date Dose VIS Date Route   Moderna COVID-19 Vaccine 08/04/2019  4:07 PM 0.5 mL 04/14/2019 Intramuscular   Manufacturer: Moderna   Lot: [id number]   NDC: 75797-282-06

## 2019-08-18 IMAGING — DX DG LUMBAR SPINE COMPLETE 4+V
5 series · 5 of 5 positions shown · non-contrast
Comparison: None.

CLINICAL DATA: Radiating low back pain

EXAM:
LUMBAR SPINE - COMPLETE 4+ VIEW

[l-spine ap]
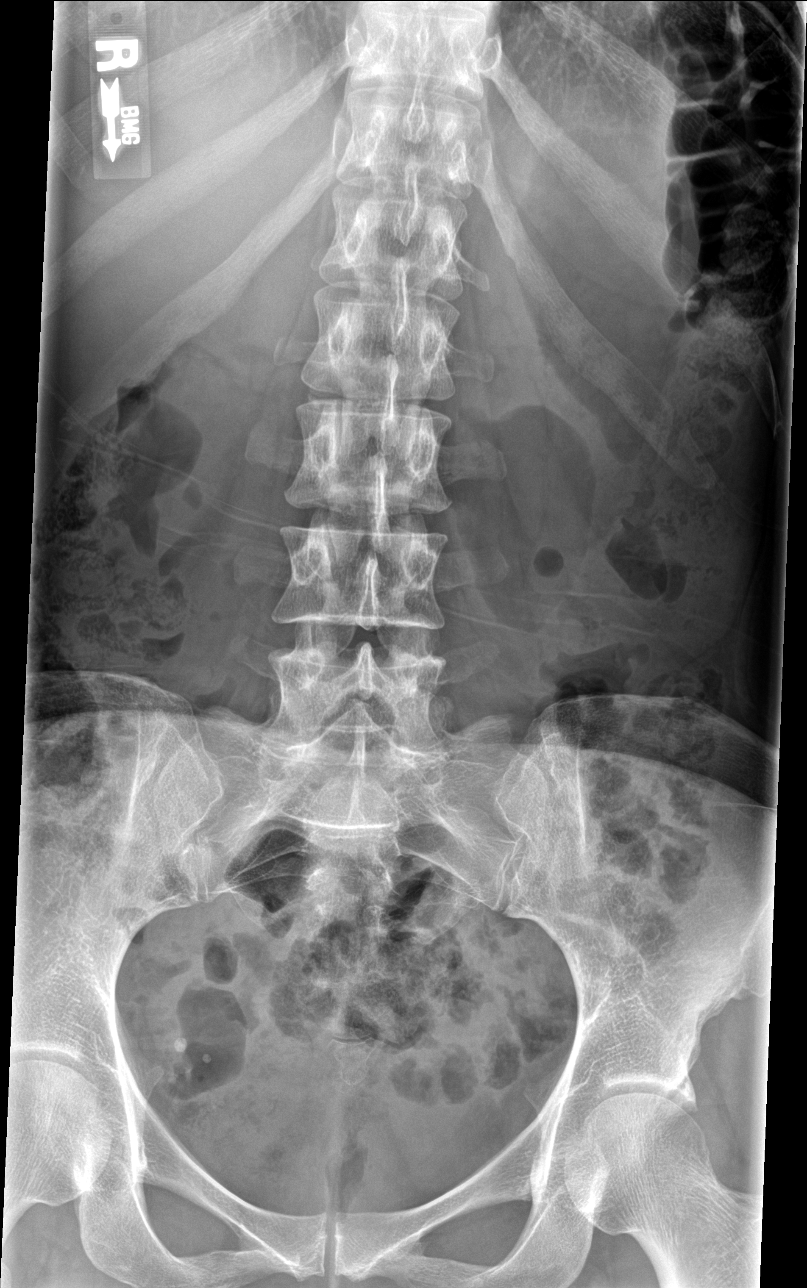

[l-spine obl (1 of 2)]
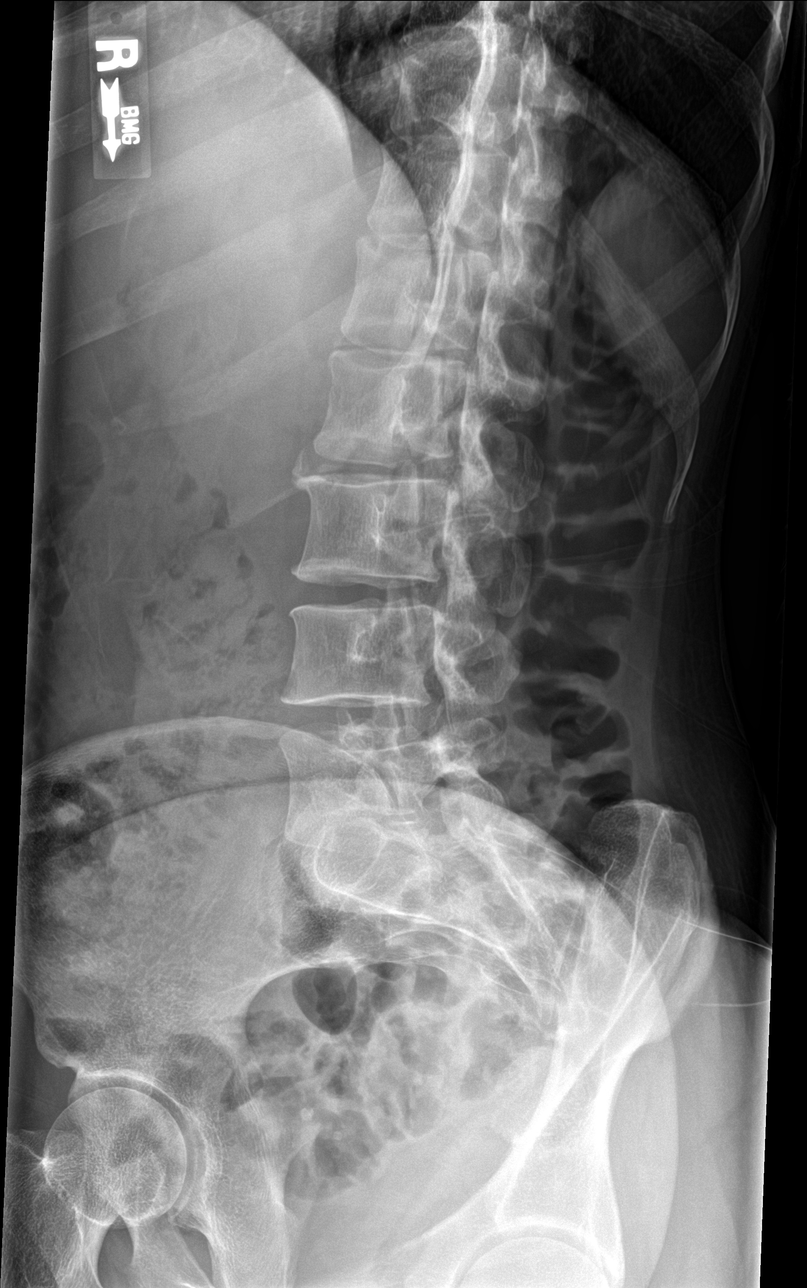

[l-spine obl (2 of 2)]
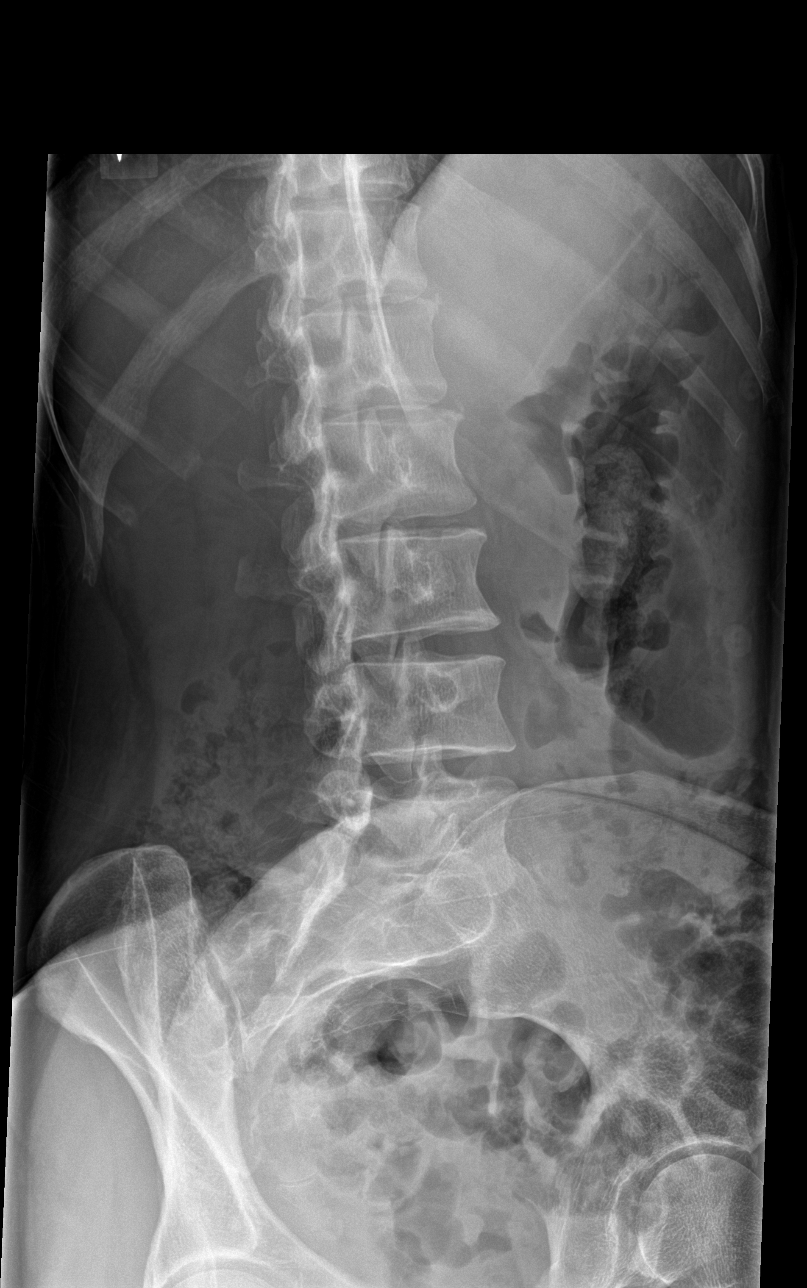

[l-spine lat]
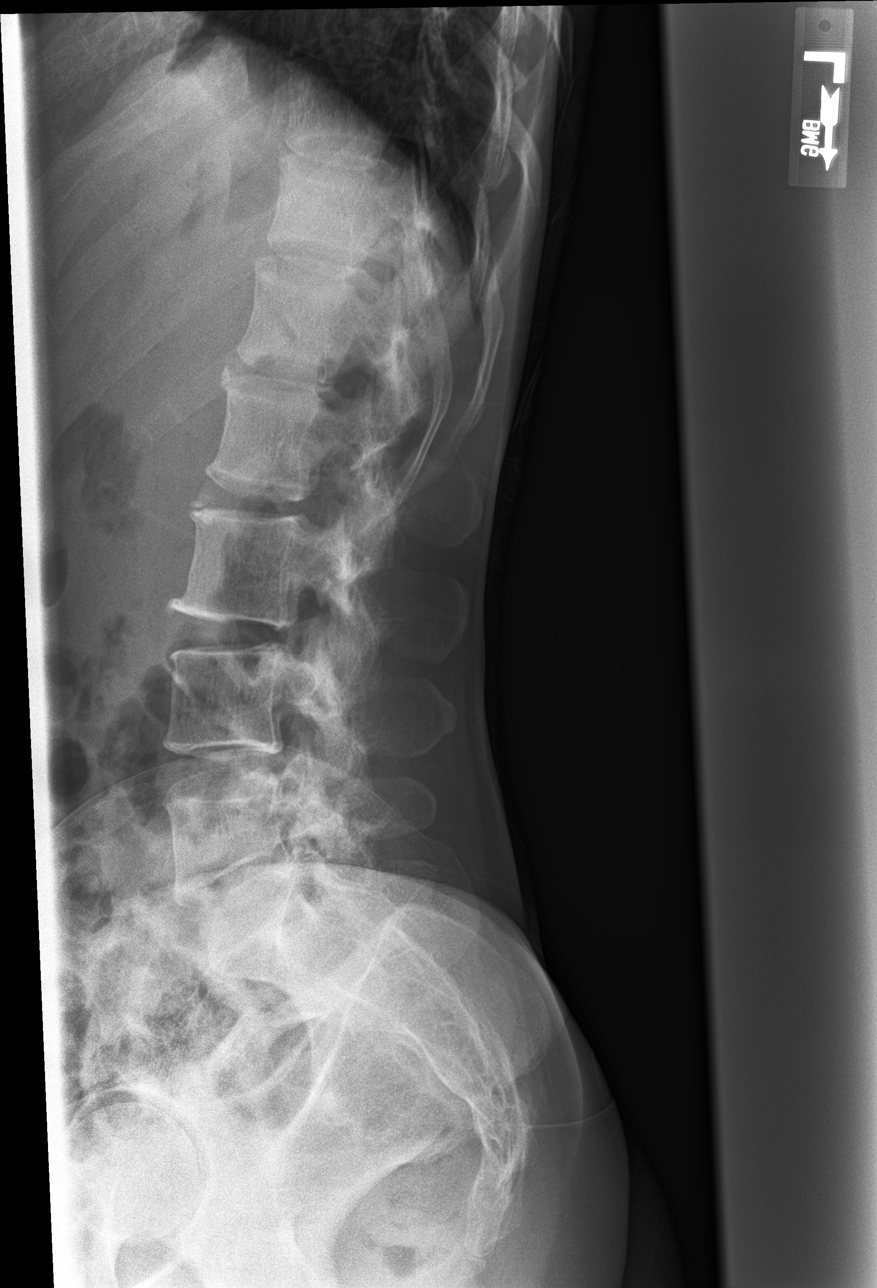

[l-spine l5-s1]
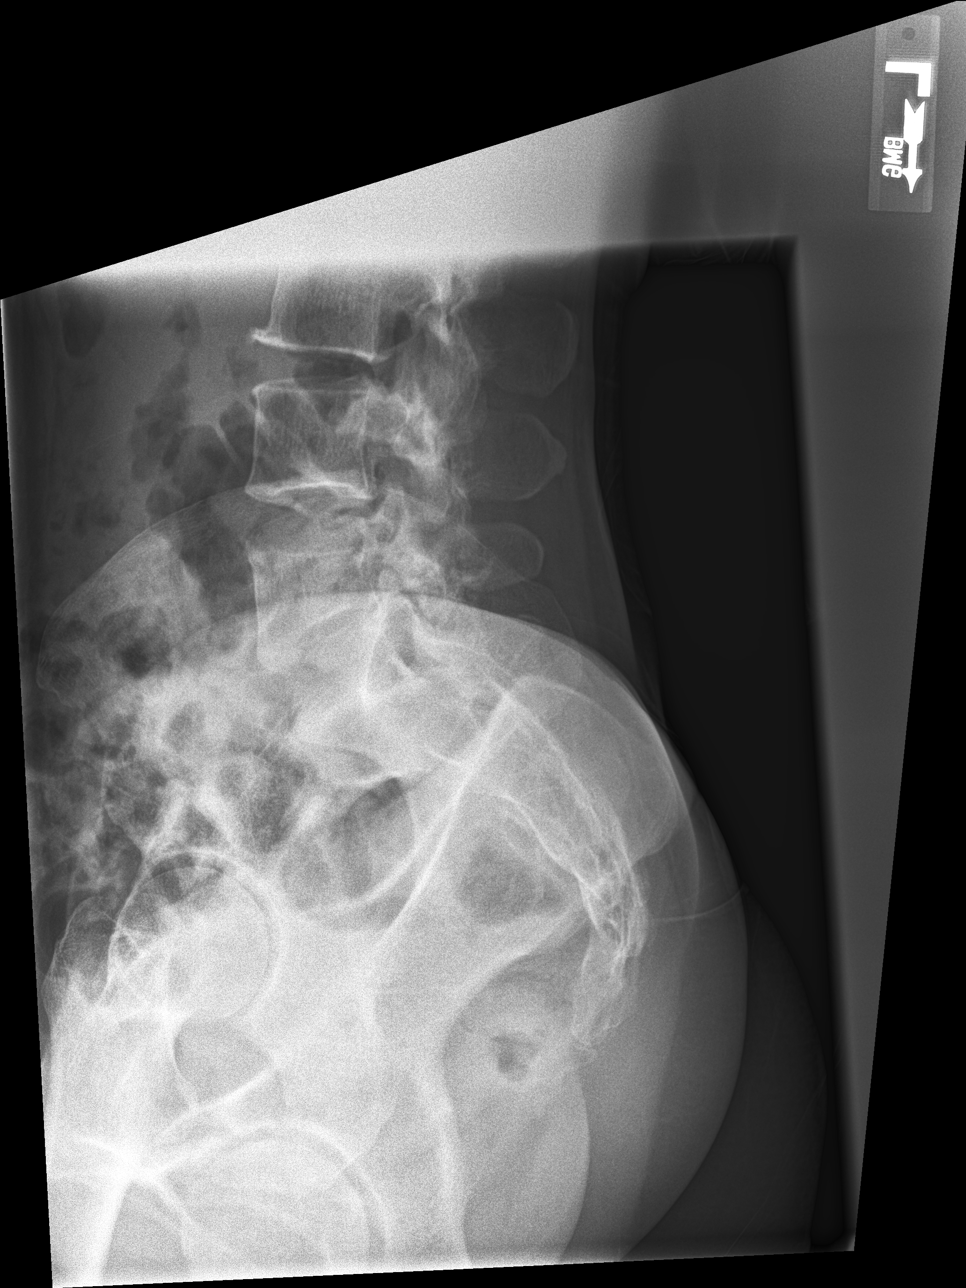

[5 of 5 positions shown; findings below may reference images not displayed]

FINDINGS: There is no evidence of lumbar spine fracture. Alignment is normal.
Intervertebral disc spaces are maintained.
IMPRESSION: Negative.

## 2024-01-07 ENCOUNTER — Other Ambulatory Visit: Payer: Self-pay | Admitting: Physician Assistant

## 2024-01-07 DIAGNOSIS — R1011 Right upper quadrant pain: Secondary | ICD-10-CM

## 2024-01-10 ENCOUNTER — Ambulatory Visit
Admission: RE | Admit: 2024-01-10 | Discharge: 2024-01-10 | Disposition: A | Discharge status: Home / Self Care | Payer: Self-pay | Source: Ambulatory Visit | Attending: Physician Assistant | Admitting: Physician Assistant

## 2024-01-10 DIAGNOSIS — R1011 Right upper quadrant pain: Secondary | ICD-10-CM
